# Patient Record
Sex: Male | Born: 1985 | Race: White | Hispanic: No | Marital: Single | Smoking: Never smoker
Health system: Southern US, Community
[De-identification: ages and names within clinical notes are randomized; demographics above are authoritative.]

## PROBLEM LIST (undated history)

## (undated) DIAGNOSIS — E785 Hyperlipidemia, unspecified: Secondary | ICD-10-CM

## (undated) DIAGNOSIS — K649 Unspecified hemorrhoids: Secondary | ICD-10-CM

## (undated) DIAGNOSIS — J45909 Unspecified asthma, uncomplicated: Secondary | ICD-10-CM

## (undated) HISTORY — DX: Hyperlipidemia, unspecified: E78.5

## (undated) HISTORY — DX: Unspecified hemorrhoids: K64.9

## (undated) HISTORY — DX: Unspecified asthma, uncomplicated: J45.909

## (undated) HISTORY — PX: CIRCUMCISION: SUR203

## (undated) HISTORY — PX: WISDOM TOOTH EXTRACTION: SHX21

## (undated) SURGERY — Surgical Case
Anesthesia: *Unknown

---

## 2012-09-09 ENCOUNTER — Emergency Department (HOSPITAL_COMMUNITY): Payer: No Typology Code available for payment source

## 2012-09-09 ENCOUNTER — Encounter (HOSPITAL_COMMUNITY): Payer: Self-pay | Admitting: Emergency Medicine

## 2012-09-09 ENCOUNTER — Emergency Department (HOSPITAL_COMMUNITY)
Admission: EM | Admit: 2012-09-09 | Discharge: 2012-09-09 | Disposition: A | Discharge status: Home / Self Care | Payer: No Typology Code available for payment source | Attending: Emergency Medicine | Admitting: Emergency Medicine

## 2012-09-09 DIAGNOSIS — Y92410 Unspecified street and highway as the place of occurrence of the external cause: Secondary | ICD-10-CM | POA: Insufficient documentation

## 2012-09-09 DIAGNOSIS — S139XXA Sprain of joints and ligaments of unspecified parts of neck, initial encounter: Secondary | ICD-10-CM | POA: Insufficient documentation

## 2012-09-09 DIAGNOSIS — Y9389 Activity, other specified: Secondary | ICD-10-CM | POA: Insufficient documentation

## 2012-09-09 MED ORDER — CYCLOBENZAPRINE HCL 10 MG PO TABS: 10.0000 mg | ORAL_TABLET | Freq: Two times a day (BID) | ORAL | Status: DC | PRN
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
## 2012-09-10 NOTE — ED Provider Notes (Signed)
Medical screening examination/treatment/procedure(s) were performed by non-physician practitioner and as supervising physician I was immediately available for consultation/collaboration. Vyctoria Dickman, MD, FACEP   [place] Doster L Jazilyn Siegenthaler, MD 09/10/12 1239 

## 2013-02-25 ENCOUNTER — Encounter: Payer: Self-pay | Admitting: Gastroenterology

## 2013-03-25 ENCOUNTER — Encounter: Payer: Self-pay | Admitting: Gastroenterology

## 2013-03-25 ENCOUNTER — Ambulatory Visit (INDEPENDENT_AMBULATORY_CARE_PROVIDER_SITE_OTHER): Payer: BC Managed Care – PPO | Admitting: Gastroenterology

## 2013-03-25 VITALS — BP 116/70 | HR 60 | Ht 71.0 in | Wt 138.0 lb

## 2013-03-25 DIAGNOSIS — K921 Melena: Secondary | ICD-10-CM

## 2013-03-25 DIAGNOSIS — R109 Unspecified abdominal pain: Secondary | ICD-10-CM

## 2013-03-25 MED ORDER — HYDROCORTISONE ACE-PRAMOXINE 2.5-1 % RE CREA: 1.0000 "application " | TOPICAL_CREAM | Freq: Two times a day (BID) | RECTAL | Status: DC

## 2013-03-25 NOTE — Progress Notes (Signed)
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 As of the first time I've ever met him.   6 month of   Was having a lot of lower abd pains, mucousy stools, bloody, especially with a lot of activity.  Green stools at times.  12 months ago was having pretty normal BMs.  Had constipation early on, pushing and straining to move bowels.    Looser, sometimes no form at all.  Bleeding has improved in past 2 months.   Dad may have had colitis.  Has lost weight 7 pounds, he can tell.    Pretty active usually especially in summer.    Having a lot of pains in abdomen at times.  Bleeding seems to be improving.    Can see hemorrhoids.  Uncomfortable anus.  Took suppositories and this seemed to make things worse.  Review of systems: Pertinent positive and negative review of systems were noted in the above HPI section. Complete review of systems was performed and was otherwise normal.    Past Medical History  Diagnosis Date  . Hemorrhoids   . Colitis     Past Surgical History  Procedure Laterality Date  . Wisdom tooth extraction      Current Outpatient Prescriptions  Medication Sig Dispense Refill  . docusate sodium (COLACE) 100 MG capsule Take 100 mg by mouth daily as needed for mild constipation.      Marland Kitchen PROCTOZONE-HC 2.5 % rectal cream        No current facility-administered medications for this visit.    Allergies as of 03/25/2013 - Review Complete 03/25/2013  Allergen Reaction Noted  . Augmentin [amoxicillin-pot clavulanate] Hives and Rash 09/09/2012    Family History  Problem Relation Age of Onset  . Diabetes Mother   . Colitis Father   . Diabetes Father   . Colitis Paternal Aunt   . Diabetes Paternal Grandmother     History   Social History  . Marital Status: Single    Spouse Name: N/A    Number of Children: N/A  . 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 Smoking status: Never Smoker   . Smokeless tobacco: Never Used  . Alcohol Use: Yes  . Drug Use: No  . Sexual Activity: Not on file   Other Topics Concern  . Not on file   Social History Narrative  . No narrative on file       Physical Exam: BP 116/70  Pulse 60  Ht 5\' 11"  (1.803 m)  Wt 138 lb (62.596 kg)  BMI 19.26 kg/m2 Constitutional: generally well-appearing Psychiatric: alert and oriented x3 Eyes: extraocular movements intact Mouth: oral pharynx moist, no lesions Neck: supple no lymphadenopathy Cardiovascular: heart regular rate and rhythm Lungs: clear to auscultation bilaterally Abdomen: soft, nontender, nondistended, no obvious ascites, no peritoneal signs, normal bowel sounds Extremities: no lower extremity edema bilaterally Skin: no lesions on visible extremities Anorectal exam: A bit more tender than usual to digital rectal however no clear hemorrhoids, no clear anal fissures, stool is brown and Hemoccult negative, no clear masses in his distal rectum.   Assessment and plan: 27 y.o. male with  anal discomfort, looser than usual stools, cramping  Am suspicious that he might have proctitis or even left-sided colitis. I do not detect any fissures on exam but this can sometimes be very  small and hard to see. I recommended sitz baths, called in a prescription for topical ointment to be placed after sitz bath. Recommended he try Citrucel fiber supplements. I also want to perform a flexible sigmoidoscopy at his soonest convenience.

## 2013-03-25 NOTE — Patient Instructions (Addendum)
You will be set up for a flexible sigmoidoscopy (at [facility] with moderate sedation). Sitz baths twice daily (15-20 min at a time). Prescription ointment after sitz bath. Please start taking citrucel (orange flavored) powder fiber supplement.  This may cause some bloating at first but that usually goes away. Begin with a small spoonful and work your way up to a large, heaping spoonful daily over a week.

## 2013-04-08 ENCOUNTER — Ambulatory Visit (AMBULATORY_SURGERY_CENTER): Payer: BC Managed Care – PPO | Admitting: Gastroenterology

## 2013-04-08 ENCOUNTER — Encounter: Payer: Self-pay | Admitting: Gastroenterology

## 2013-04-08 VITALS — BP 113/71 | HR 60 | Temp 97.1°F | Resp 24 | Ht 71.0 in | Wt 138.0 lb

## 2013-04-08 DIAGNOSIS — D126 Benign neoplasm of colon, unspecified: Secondary | ICD-10-CM

## 2013-04-08 DIAGNOSIS — R933 Abnormal findings on diagnostic imaging of other parts of digestive tract: Secondary | ICD-10-CM

## 2013-04-08 DIAGNOSIS — K625 Hemorrhage of anus and rectum: Secondary | ICD-10-CM

## 2013-04-08 DIAGNOSIS — R109 Unspecified abdominal pain: Secondary | ICD-10-CM

## 2013-04-08 DIAGNOSIS — K921 Melena: Secondary | ICD-10-CM

## 2013-04-08 MED ORDER — SODIUM CHLORIDE 0.9 % IV SOLN: 500.0000 mL | INTRAVENOUS | Status: DC

## 2013-04-08 NOTE — Patient Instructions (Signed)
Discharge instructions given with verbal understanding. Biopsies taken. Resume previous medications. YOU HAD AN ENDOSCOPIC PROCEDURE TODAY AT THE [facility]: Refer to the procedure report that was given to you for any specific questions about what was found during the examination.  If the procedure report does not answer your questions, please call your gastroenterologist to clarify.  If you requested that your care partner not be given the details of your procedure findings, then the procedure report has been included in a sealed envelope for you to review at your convenience later.  YOU SHOULD EXPECT: Some feelings of bloating in the abdomen. Passage of more gas than usual.  Walking can help get rid of the air that was put into your GI tract during the procedure and reduce the bloating. If you had a lower endoscopy (such as a colonoscopy or flexible sigmoidoscopy) you may notice spotting of blood in your stool or on the toilet paper. If you underwent a bowel prep for your procedure, then you may not have a normal bowel movement for a few days.  DIET: Your first meal following the procedure should be a light meal and then it is ok to progress to your normal diet.  A half-sandwich or bowl of soup is an example of a good first meal.  Heavy or fried foods are harder to digest and may make you feel nauseous or bloated.  Likewise meals heavy in dairy and vegetables can cause extra gas to form and this can also increase the bloating.  Drink plenty of fluids but you should avoid alcoholic beverages for 24 hours.  ACTIVITY: Your care partner should take you home directly after the procedure.  You should plan to take it easy, moving slowly for the rest of the day.  You can resume normal activity the day after the procedure however you should NOT DRIVE or use heavy machinery for 24 hours (because of the sedation medicines used during the test).    SYMPTOMS TO REPORT IMMEDIATELY: A gastroenterologist  can be reached at any hour.  During normal business hours, 8:30 AM to 5:00 PM Monday through Friday, call [phone].  After hours and on weekends, please call the GI answering service at [phone] who will take a message and have the physician on call contact you.   Following lower endoscopy (colonoscopy or flexible sigmoidoscopy):  Excessive amounts of blood in the stool  Significant tenderness or worsening of abdominal pains  Swelling of the abdomen that is new, acute  Fever of 100F or higher  FOLLOW UP: If any biopsies were taken you will be contacted by phone or by letter within the next 1-3 weeks.  Call your gastroenterologist if you have not heard about the biopsies in 3 weeks.  Our staff will call the home number listed on your records the next business day following your procedure to check on you and address any questions or concerns that you may have at that time regarding the information given to you following your procedure. This is a courtesy call and so if there is no answer at the home number and we have not heard from you through the emergency physician on call, we will assume that you have returned to your regular daily activities without incident.  SIGNATURES/CONFIDENTIALITY: You and/or your care partner have signed paperwork which will be entered into your electronic medical record.  These signatures attest to the fact that that the information above on your After Visit Summary has been reviewed   and is understood.  Full responsibility of the confidentiality of this discharge information lies with you and/or your care-partner.  

## 2013-04-08 NOTE — Progress Notes (Addendum)
Right after IV was placed in right hand by April Mirtz, RN pt c/o "tingling in right hand" when IV fluid was turned on.  IV was assessed and looks good.  I continued to ask questions and he said his right hand also had ring finger and pinky felt "numb".  Dorene Grebe was called in to speak with the pt.  Pt reports his whole right hand was feeling numb now.  Natalie assess IV also and felt IV was working well.  Pt then  Reported his left hand also felt numb.  Pt breathing was rapid.  I explained he needed to slow his breathing down and I showed him how to breath in through his nose and out through his mouth.  Jennye Boroughs, RN felt the pt was hyperventolating.  Haynes Bast, RN was also at bedside and also agreed the pt was very anxious and hyperventolating.  Pt's whole body was shaking.  Per Jennye Boroughs, RN and Haynes Bast, RN continued to speak with pt and calm him.  Pt was taken to the procedure room for flex sigmoid to be done. IV in right hand was left in place and not taken out. maw

## 2013-04-08 NOTE — Op Note (Signed)
[facility] [address] [place], [address]   FLEX SIGMOIDOSCOPY PROCEDURE REPORT  PATIENT: Troy Powell, Troy Powell  MR#: [id number] BIRTHDATE: January 01, 1986 , 27  yrs. old GENDER: Male ENDOSCOPIST: Rachael Fee, MD REFERRED QI:ONGEXBMW Mann, PA-C PROCEDURE DATE:  04/08/2013 PROCEDURE:   Sigmoidoscopy with biopsy INDICATIONS:rectal bleeding, anal and abdominal discomfort. MEDICATIONS: Fentanyl 75 mcg IV, Versed 8 mg IV, and These medications were titrated to patient response per physician's verbal order  DESCRIPTION OF PROCEDURE:    Physical exam was performed.  Informed consent was obtained from the patient after explaining the benefits, risks, and alternatives to procedure.  The patient was connected to monitor and placed in left lateral position. Continuous oxygen was provided by nasal cannula and IV medicine administered through an indwelling cannula.  After administration of sedation and rectal exam, the patients rectum was intubated and the LB [id number]  colonoscope was advanced under direct visualization to the splenic flexure.  The scope was removed slowly by carefully examining the color, texture, anatomy, and integrity mucosa on the way out.  The patient was recovered in endoscopy and discharged home in satisfactory condition.    COLON FINDINGS: The most distal 2-3cm of rectum was slightly erythematous, perhaps mildly inflammed.  Biopsies were taken.  The mucosa to the splenic flexure was otherwise normal.  There were no internal hemorrhoids.  There was a small, non-thrombosed external hemorrhoid.  There was no clear fissure.  He could not tolerate retroflex examination. PREP QUALITY: good  COMPLICATIONS: None  ENDOSCOPIC IMPRESSION: The most distal 2-3cm of rectum was slightly erythematous, perhaps mildly inflammed.  Biopsies were taken.  The mucosa to the splenic flexure was otherwise normal.  There were no internal hemorrhoids. There was a  small, non-thrombosed external hemorrhoid.  There was no clear fissure.  He could not tolerate retroflex examination.   RECOMMENDATIONS: Await final biopsy results.   _______________________________ eSignedRachael Fee, MD 04/08/2013 3:10 PM

## 2013-04-09 ENCOUNTER — Telehealth: Payer: Self-pay | Admitting: *Deleted

## 2013-04-09 NOTE — Telephone Encounter (Signed)
  Follow up Call-  Call back number 04/08/2013  Post procedure Call Back phone  # [phone] cell  Permission to leave phone message Yes     Patient questions:  Do you have a fever, pain , or abdominal swelling? no Pain Score  0 *  Have you tolerated food without any problems? yes  Have you been able to return to your normal activities? yes  Do you have any questions about your discharge instructions: Diet   no Medications  no Follow up visit  no  Do you have questions or concerns about your Care? no  Actions: * If pain score is 4 or above: No action needed, pain <4.

## 2013-04-11 ENCOUNTER — Other Ambulatory Visit: Payer: BC Managed Care – PPO | Admitting: Gastroenterology

## 2013-04-17 ENCOUNTER — Encounter: Payer: Self-pay | Admitting: Gastroenterology

## 2013-04-24 ENCOUNTER — Telehealth: Payer: Self-pay | Admitting: Gastroenterology

## 2013-04-24 NOTE — Telephone Encounter (Signed)
Patient notified of results of pathology. Patient wants to know if he can possibly speak to the doctor because he is still having a lot of anal discomfort. Patient states he cannot sit down at times due to the pain. Told patient that Dr. Eugenia Pancoast nurse has unfortunately left for today and Dr. Eugenia Pancoast is at the hospital but I can inform them that of the pain he is still in and hopefully we can get back to him first thing in the morning. 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 Patient would like call a call tomorrow after 4:15pm by the nurse of Dr. Eugenia Pancoast if possible.

## 2013-04-24 NOTE — Telephone Encounter (Signed)
Letter was mailed to pt with results    Dear Mr. Flenner,  The biopsies taken during your recent colonoscopy showed NO inflammation or infection.  You should continue to follow the recommendations that we discussed at the time of your procedure.   If you have any questions or concerns, please don't hesitate to call.  Sincerely,    Milus Banister, MD

## 2013-04-25 NOTE — Telephone Encounter (Signed)
Really sounds like pain from small fissure but none was seen at procedure.  Only small amount of erythema in distal rectum.  Biopsies did not show chronic inflammation but I'd still like to try him on canasa suppository, 1gram suppository, Per rectum, once daily, probably best to be done at bedtime.  Disp 30 with 3 refills, rov with me in 3-4 weeks.

## 2013-04-28 NOTE — Telephone Encounter (Signed)
Left message on machine to call back  

## 2013-04-30 ENCOUNTER — Telehealth: Payer: Self-pay | Admitting: *Deleted

## 2013-04-30 MED ORDER — MESALAMINE 1000 MG RE SUPP: 1000.0000 mg | Freq: Every day | RECTAL | Status: DC

## 2013-04-30 NOTE — Telephone Encounter (Signed)
Patient called back. I advised him of Dr. Eugenia Pancoast message below, made follow up office visit and sent in [place].    Milus Banister, MD at 04/25/2013 7:35 AM     Status: Signed        Really sounds like pain from small fissure but none was seen at procedure. Only small amount of erythema in distal rectum. Biopsies did not show chronic inflammation but I'd still like to try him on canasa suppository, 1gram suppository, Per rectum, once daily, probably best to be done at bedtime. Disp 30 with 3 refills, rov with me in 3-4 weeks.

## 2013-04-30 NOTE — Telephone Encounter (Signed)
Pt has been notified and ROV scheduled.  Medication has been sent

## 2013-05-30 ENCOUNTER — Ambulatory Visit (INDEPENDENT_AMBULATORY_CARE_PROVIDER_SITE_OTHER): Payer: BC Managed Care – PPO | Admitting: Gastroenterology

## 2013-05-30 ENCOUNTER — Encounter: Payer: Self-pay | Admitting: Gastroenterology

## 2013-05-30 VITALS — BP 96/62 | HR 72 | Ht 69.75 in | Wt 143.5 lb

## 2013-05-30 DIAGNOSIS — K6289 Other specified diseases of anus and rectum: Secondary | ICD-10-CM

## 2013-05-30 NOTE — Patient Instructions (Signed)
Continue fiber supplement daily. Continue current hygein practices. Please return to see Dr. Ardis Hughs in 5-6 weeks. Sooner if needed. No ointments, suppositories for now.

## 2013-05-30 NOTE — Progress Notes (Signed)
Review of pertinent gastrointestinal problems: Anal discomfort: Flex sigmoidoscopy Troy Powell 03/2013: The most distal 2-3cm of rectum was slightly erythematous, perhaps mildly inflammed. Biopsies were taken and were all normal. The mucosa to the splenic flexure was otherwise normal. There were no internal hemorrhoids. There was a small, non-thrombosed external hemorrhoid. There was no clear fissure. He could not tolerate retroflex examination.  Despite normal biopsies a trial of cansasa suppositories undertaken.   HPI: This is a  very pleasant 28 year old man whom I last saw about one to 2 months ago.  He is overall better than a couple months ago. About 2 days per week he will still have issues;  Perianal discomfort, pain.    Did not start canasa suppository.  He has stopped creams, ointments for the past 2 weeks.    The stomach pains are much improved, can still occur in AM. This is overall better.   Past Medical History  Diagnosis Date  . Hemorrhoids   . Asthma   . Hyperlipidemia     diet controlled, no meds    Past Surgical History  Procedure Laterality Date  . Wisdom tooth extraction    . Circumcision      Current Outpatient Prescriptions  Medication Sig Dispense Refill  . methylcellulose (CITRUCEL) oral powder Take 1 packet by mouth daily.       No current facility-administered medications for this visit.    Allergies as of 05/30/2013 - Review Complete 05/30/2013  Allergen Reaction Noted  . Augmentin [amoxicillin-pot clavulanate] Hives and Rash 09/09/2012    Family History  Problem Relation Age of Onset  . Diabetes Mother   . Diabetes Father   . Diabetes Paternal Grandmother   . Colon cancer Neg Hx   . Esophageal cancer Neg Hx   . Rectal cancer Neg Hx   . Stomach cancer Neg Hx     History   Social History  . Marital Status: Single    Spouse Name: N/A    Number of Children: N/A  . 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 Smoking status: Never Smoker   . Smokeless tobacco: Never Used  . Alcohol Use: 2.4 oz/week    4 Cans of beer per week  . Drug Use: No  . Sexual Activity: Not on file   Other Topics Concern  . Not on file   Social History Narrative  . No narrative on file      Physical Exam: BP 96/62  Pulse 72  Ht 5' 9.75" (1.772 m)  Wt 143 lb 8 oz (65.091 kg)  BMI 20.73 kg/m2 Constitutional: generally well-appearing Psychiatric: alert and oriented x3 Abdomen: soft, nontender, nondistended, no obvious ascites, no peritoneal signs, normal bowel sounds Rectal examination: Normal    Assessment and plan: 28 y.o. male with intermittent anorectal discomfort  His anal discomfort is improving with fiber supplements, changed hygiene regimen.  He'll continue these changes and will return to see me in 5-6 weeks and sooner if needed.

## 2013-07-01 ENCOUNTER — Ambulatory Visit: Payer: BC Managed Care – PPO | Admitting: Gastroenterology

## 2013-09-30 IMAGING — CR DG THORACIC SPINE 2V
2 series · 2 of 2 positions shown · non-contrast
Comparison: None.

CLINICAL DATA: Back pain.  Posterior neck pain.

THORACIC SPINE - 2 VIEW

[w thoracic spine ap]
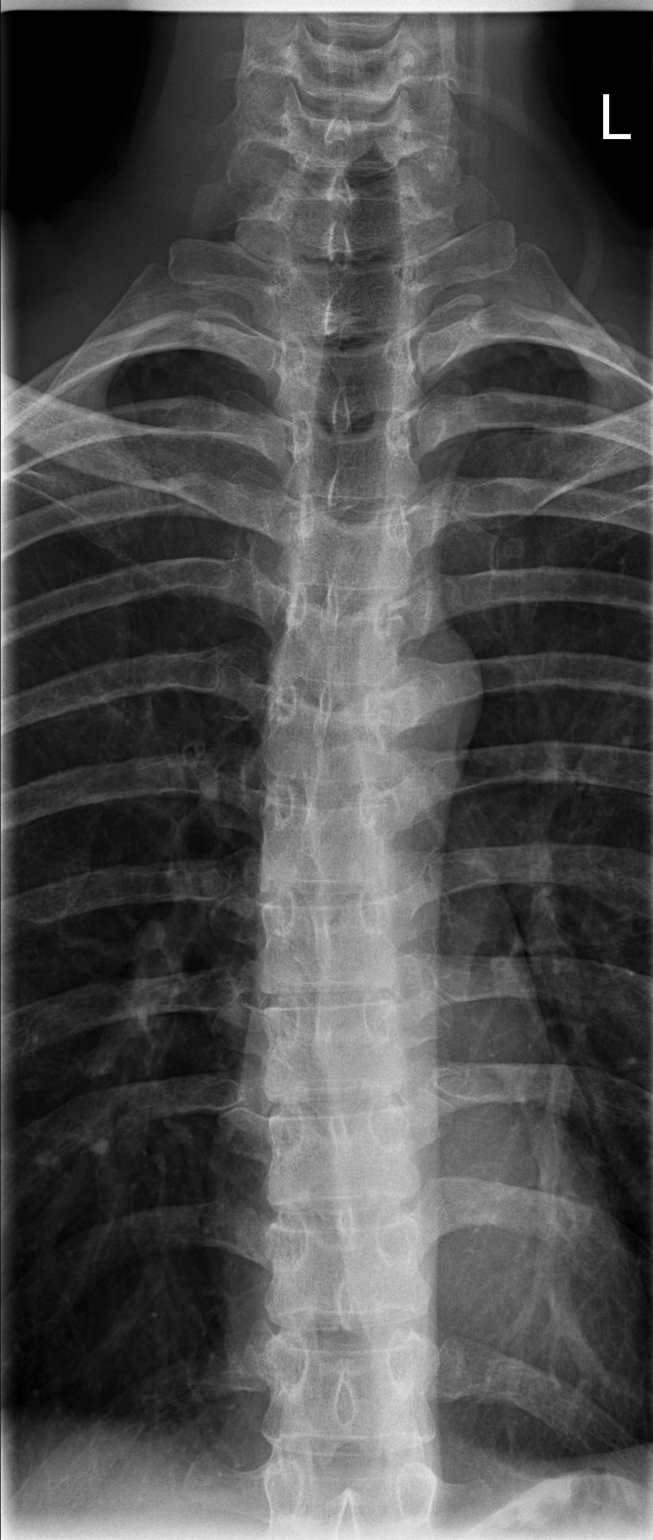

[w thoracic spine lat]
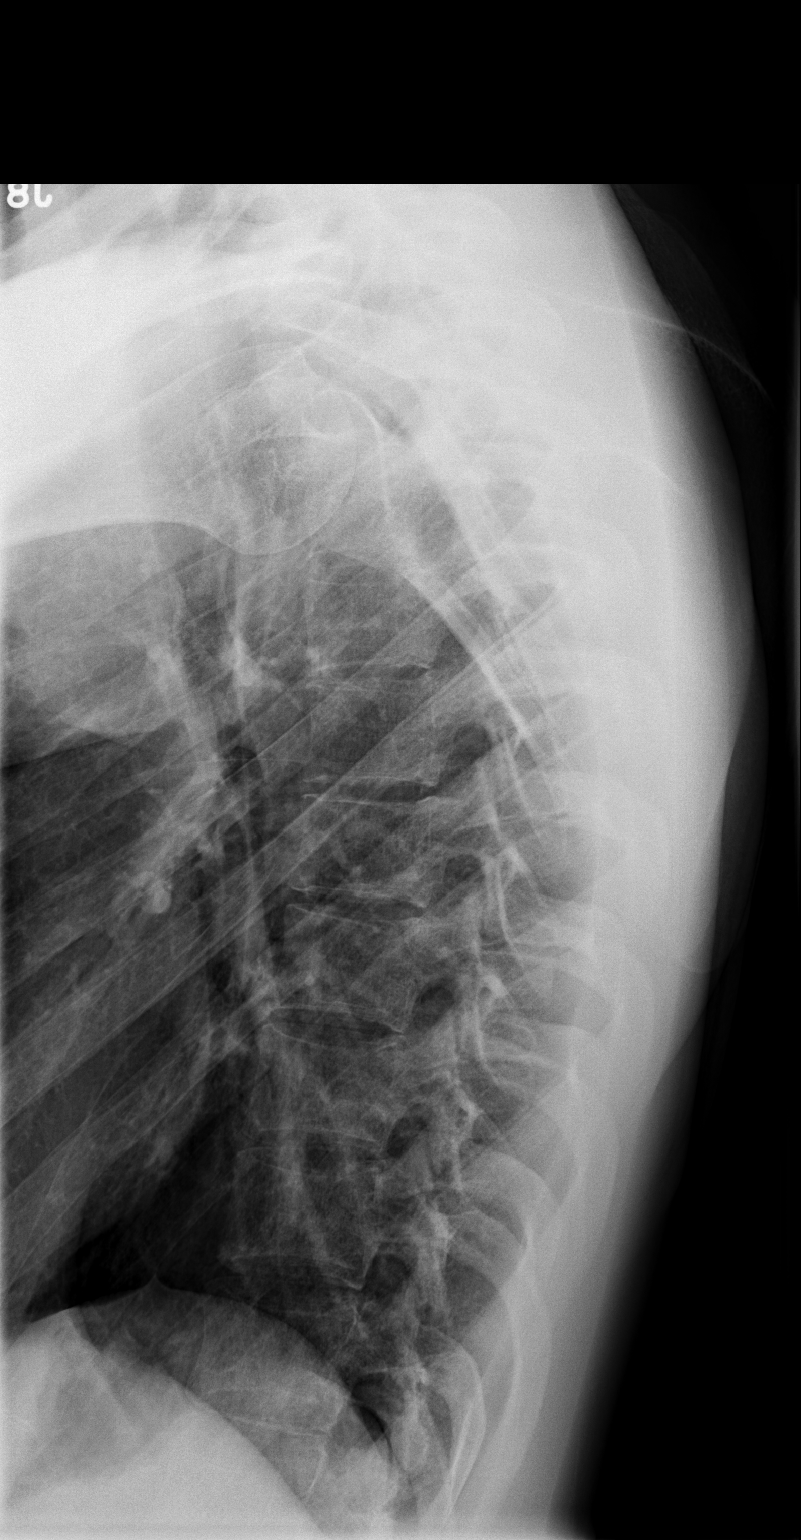

[2 of 2 positions shown; findings below may reference images not displayed]

FINDINGS: Mild dextroconvex curvature of the thoracic spine may be
positional or secondary to spasm.  No fracture.  Paraspinal lines
appear within normal limits.
IMPRESSION: Mild dextroconvex curvature, probably positional or secondary to
spasm.
